# Patient Record
Sex: Male | Born: 2000
Health system: Southern US, Community
[De-identification: ages and names within clinical notes are randomized; demographics above are authoritative.]

## PROBLEM LIST (undated history)

## (undated) ENCOUNTER — Emergency Department (HOSPITAL_COMMUNITY): Discharge status: Absent without leave | Payer: Self-pay

## (undated) HISTORY — PX: FOOT SURGERY: SHX648

## (undated) HISTORY — PX: DENTAL SURGERY: SHX609

---
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
## 2021-06-21 ENCOUNTER — Ambulatory Visit (INDEPENDENT_AMBULATORY_CARE_PROVIDER_SITE_OTHER): Payer: Commercial Managed Care - PPO

## 2021-06-21 ENCOUNTER — Ambulatory Visit
Admission: EM | Admit: 2021-06-21 | Discharge: 2021-06-21 | Disposition: A | Discharge status: Home / Self Care | Payer: Commercial Managed Care - PPO | Attending: Urgent Care | Admitting: Urgent Care

## 2021-06-21 ENCOUNTER — Other Ambulatory Visit: Payer: Self-pay

## 2021-06-21 DIAGNOSIS — M79672 Pain in left foot: Secondary | ICD-10-CM | POA: Diagnosis not present

## 2021-06-21 DIAGNOSIS — M25472 Effusion, left ankle: Secondary | ICD-10-CM

## 2021-06-21 DIAGNOSIS — M25572 Pain in left ankle and joints of left foot: Secondary | ICD-10-CM

## 2021-06-21 DIAGNOSIS — S93402A Sprain of unspecified ligament of left ankle, initial encounter: Secondary | ICD-10-CM | POA: Diagnosis not present

## 2021-06-21 MED ORDER — NAPROXEN 500 MG PO TABS: 500.0000 mg | ORAL_TABLET | Freq: Two times a day (BID) | ORAL | 0 refills | Status: DC

## 2021-06-21 NOTE — ED Triage Notes (Signed)
Pt presents with pain and swelling in the left ankle, after he twisted playing basketball.  ?

## 2021-06-21 NOTE — ED Provider Notes (Signed)
?[place] ? ? ?MRN: [id number] DOB: 2000/08/04 ? ?Subjective:  ? ?Carl Calhoun is a 21 y.o. male presenting for acute onset today of left foot pain, left ankle pain.  Patient was playing basketball and twisted it from another player stepping on his foot.  He had immediate difficulty bearing any kind of weight.  Has severe pain but does not want any medication in the clinic.   ? ?No current facility-administered medications for this encounter. ? ?Current Outpatient Medications:  ?  meclizine (ANTIVERT) 25 MG tablet, Take 1 tablet (25 mg total) by mouth 3 (three) times daily as needed for dizziness., Disp: 30 tablet, Rfl: 0 ?  ondansetron (ZOFRAN) 4 MG tablet, Take 1 tablet (4 mg total) by mouth every 6 (six) hours., Disp: 12 tablet, Rfl: 0  ? ?No Known Allergies ? ?History reviewed. No pertinent past medical history.  ? ?Past Surgical History:  ?Procedure Laterality Date  ? DENTAL SURGERY    ? FOOT SURGERY Left   ? aug 2019  ? ? ?Family History  ?Problem Relation Age of Onset  ? Cancer Maternal Grandfather   ? Cancer Paternal Grandfather   ? ? ?Social History  ? ?Tobacco Use  ? Smoking status: Never  ? Smokeless tobacco: Never  ?Vaping Use  ? Vaping Use: Never used  ?Substance Use Topics  ? Alcohol use: No  ? Drug use: Never  ? ? ?ROS ? ? ?Objective:  ? ?Vitals: ?BP 135/81 (BP Location: Right Arm)   Pulse 80   Temp 98.8 ?F (37.1 ?C) (Oral)   Resp 16   SpO2 96%  ? ?Physical Exam ?Constitutional:   ?   General: He is not in acute distress. ?   Appearance: Normal appearance. He is well-developed and normal weight. He is not ill-appearing, toxic-appearing or diaphoretic.  ?HENT:  ?   Head: Normocephalic and atraumatic.  ?   Right Ear: External ear normal.  ?   Left Ear: External ear normal.  ?   Nose: Nose normal.  ?   Mouth/Throat:  ?   Pharynx: Oropharynx is clear.  ?Eyes:  ?   General: No scleral icterus.    ?   Right eye: No discharge.     ?   Left eye: No discharge.  ?   Extraocular  Movements: Extraocular movements intact.  ?Cardiovascular:  ?   Rate and Rhythm: Normal rate.  ?Pulmonary:  ?   Effort: Pulmonary effort is normal.  ?Musculoskeletal:  ?   Cervical back: Normal range of motion.  ?   Left ankle: Swelling present. No deformity, ecchymosis or lacerations. Tenderness present over the lateral malleolus, ATF ligament, AITF ligament, CF ligament, posterior TF ligament and base of 5th metatarsal. No medial malleolus or proximal fibula tenderness. Decreased range of motion.  ?   Left Achilles Tendon: No tenderness or defects. Thompson's test negative.  ?Neurological:  ?   Mental Status: He is alert and oriented to person, place, and time.  ?Psychiatric:     ?   Mood and Affect: Mood normal.     ?   Behavior: Behavior normal.     ?   Thought Content: Thought content normal.     ?   Judgment: Judgment normal.  ? ? ?DG Ankle Complete Left ? ?Result Date: 06/21/2021 ?CLINICAL DATA:  Left ankle pain. Injury. Twisted ankle playing basketball today. Lateral pain. Had surgery on this foot 3 years ago. EXAM: LEFT ANKLE COMPLETE - 3+ VIEW COMPARISON:  Left foot radiographs 11/21/2017 FINDINGS: The ankle mortise is symmetric and intact. Moderate lateral malleolar soft tissue swelling. Joint spaces are preserved. No acute fracture is seen. No dislocation. Partial visualization of unchanged chronic ossicle at the dorsal aspect of the tarsometatarsal joints on lateral view. IMPRESSION: Moderate lateral malleolar soft tissue swelling. No acute fracture is seen. Electronically Signed   By: Yvonne Kendall M.D.   On: 06/21/2021 17:38   ? ?Left ankle wrapped using 4" Ace wrap in figure-8 method. ? ? ?Assessment and Plan :  ? ?PDMP not reviewed this encounter. ? ?1. Sprain of left ankle, unspecified ligament, initial encounter   ?2. Pain and swelling of left ankle   ?3. Left foot pain   ? ?Will manage for ankle sprain with rice method, NSAID.  Ambulate with crutches as needed, they do have some at home.   Counseled patient on potential for adverse effects with medications prescribed/recommended today, ER and return-to-clinic precautions discussed, patient verbalized understanding. ? ?  ?Jaynee Eagles, PA-C ?06/21/21 1803 ? ?

## 2022-07-04 ENCOUNTER — Emergency Department (HOSPITAL_COMMUNITY): Payer: BLUE CROSS/BLUE SHIELD

## 2022-07-04 ENCOUNTER — Encounter (HOSPITAL_COMMUNITY): Payer: Self-pay | Admitting: Emergency Medicine

## 2022-07-04 ENCOUNTER — Emergency Department (HOSPITAL_COMMUNITY)
Admission: EM | Admit: 2022-07-04 | Discharge: 2022-07-04 | Disposition: A | Discharge status: Home / Self Care | Payer: BLUE CROSS/BLUE SHIELD | Attending: Emergency Medicine | Admitting: Emergency Medicine

## 2022-07-04 ENCOUNTER — Other Ambulatory Visit: Payer: Self-pay

## 2022-07-04 DIAGNOSIS — R0789 Other chest pain: Secondary | ICD-10-CM | POA: Insufficient documentation

## 2022-07-04 DIAGNOSIS — R110 Nausea: Secondary | ICD-10-CM | POA: Diagnosis not present

## 2022-07-04 DIAGNOSIS — R0602 Shortness of breath: Secondary | ICD-10-CM | POA: Insufficient documentation

## 2022-07-04 DIAGNOSIS — R202 Paresthesia of skin: Secondary | ICD-10-CM | POA: Insufficient documentation

## 2022-07-04 DIAGNOSIS — R42 Dizziness and giddiness: Secondary | ICD-10-CM | POA: Insufficient documentation

## 2022-07-04 DIAGNOSIS — R079 Chest pain, unspecified: Secondary | ICD-10-CM | POA: Diagnosis present

## 2022-07-04 LAB — CBC
HCT: 46.3 % (ref 39.0–52.0)
Hemoglobin: 16.2 g/dL (ref 13.0–17.0)
MCH: 30.8 pg (ref 26.0–34.0)
MCHC: 35 g/dL (ref 30.0–36.0)
MCV: 88 fL (ref 80.0–100.0)
Platelets: 270 10*3/uL (ref 150–400)
RBC: 5.26 MIL/uL (ref 4.22–5.81)
RDW: 11.9 % (ref 11.5–15.5)
WBC: 7 10*3/uL (ref 4.0–10.5)
nRBC: 0 % (ref 0.0–0.2)

## 2022-07-04 LAB — BASIC METABOLIC PANEL
Anion gap: 10 (ref 5–15)
BUN: 17 mg/dL (ref 6–20)
CO2: 25 mmol/L (ref 22–32)
Calcium: 9.9 mg/dL (ref 8.9–10.3)
Chloride: 105 mmol/L (ref 98–111)
Creatinine, Ser: 1.12 mg/dL (ref 0.61–1.24)
GFR, Estimated: >60 mL/min (ref >60)
Glucose, Bld: 97 mg/dL (ref 70–99)
Potassium: 4.7 mmol/L (ref 3.5–5.1)
Sodium: 140 mmol/L (ref 135–145)

## 2022-07-04 LAB — TROPONIN I (HIGH SENSITIVITY)
Troponin I (High Sensitivity): <2 ng/L (ref <18)
Troponin I (High Sensitivity): <2 ng/L (ref <18)

## 2022-07-04 LAB — D-DIMER, QUANTITATIVE: D-Dimer, Quant: <0.27 ug/mL-FEU (ref 0.00–0.50)

## 2022-07-04 MED ORDER — PREDNISONE 10 MG PO TABS: ORAL_TABLET | ORAL | 0 refills | Status: DC

## 2022-07-04 MED ORDER — KETOROLAC TROMETHAMINE 60 MG/2ML IM SOLN
30.0000 mg | Freq: Once | INTRAMUSCULAR | Status: AC
  Administered 2022-07-04: 30 mg via INTRAMUSCULAR
  Filled 2022-07-04: qty 2

## 2022-07-04 NOTE — ED Triage Notes (Signed)
Pt states has had several episodes of left chest pain that is sharp with numbness under left arm. Will have dizziness as well with nausea and sob. States this has been initiated each time with exertion at the gym but "sticks around" x 3 days after. Pt arrived aching to left chest and sob. No resp distress or sob noted in triage. Color wnl. Non diahoretic.

## 2022-07-04 NOTE — ED Notes (Signed)
Dc instructions and scripts reviewed with pt and mother. No questions or concerns at this time. Will follow up with PCP if no improvement. Pt ambulated out of ED with steady gait.

## 2022-07-04 NOTE — Discharge Instructions (Signed)
Take the entire course of the prednisone prescribed.  Your lab tests, chest x-ray and EKG are all reassuring today.  I suspect your symptoms are probably secondary to the chest wall strain such as muscular or cartilage strain.  Prednisone should help this greatly.  Plan follow-up with your primary doctor if your symptoms do not improve with this treatment plan.

## 2022-07-05 ENCOUNTER — Telehealth (HOSPITAL_COMMUNITY): Payer: Self-pay | Admitting: Physician Assistant

## 2022-07-05 MED ORDER — PREDNISONE 5 MG/5ML PO SOLN: ORAL | 0 refills | Status: AC

## 2022-07-05 NOTE — ED Provider Notes (Signed)
[place] [facility]PARTMENT AT [facility] Provider Note   CSN: [id number] Arrival date & time: 07/04/22  1005     History  Chief Complaint  Patient presents with   Chest Pain   Numbness    Carl Calhoun is a 22 y.o. male with no significant past medical history presenting for evaluation of left-sided chest pain which started this morning when he was doing a lower body workout.  He describes sharp pain that is sudden onset and radiates across to his chest, also associated with numbness into his left arm area, it is associated with lightheadedness, nausea and shortness of breath.  He has had similar episodes like this over the past 6 months or more, usually associated with workouts at the gym.  The sharp pain has resolved since arrival but he still endorses an aching in his left chest and some shortness of breath.  The pain does not radiate into his back or abdomen.  It is reproducible with deep inspiration and with movement such as stretching his arm and palpating the chest.  He was not doing any weight lifting or upper body workout when this pain occurred.  With prior episodes after ibuprofen and about 3 days rest the symptoms have resolved.  Patient does not smoke, no personal or family risk factors for ACS, or cardiac illness at a early age.  He denies pain or swelling in his extremities, no recent long periods of bedrest or long periods of sedation.  No history of chest trauma.  The history is provided by the patient.       Home Medications Prior to Admission medications   Medication Sig Start Date End Date Taking? Authorizing Provider  meclizine (ANTIVERT) 25 MG tablet Take 1 tablet (25 mg total) by mouth 3 (three) times daily as needed for dizziness. 01/30/20   Wurst, GrenadaBrittany, PA-C  naproxen (NAPROSYN) 500 MG tablet Take 1 tablet (500 mg total) by mouth 2 (two) times daily with a meal. 06/21/21   Wallis BambergMani, Mario, PA-C  ondansetron (ZOFRAN) 4 MG tablet Take 1 tablet (4  mg total) by mouth every 6 (six) hours. 01/30/20   Wurst, GrenadaBrittany, PA-C  predniSONE 5 MG/5ML solution Take 60 mLs (60 mg total) by mouth daily with breakfast for 1 day, THEN 50 mLs (50 mg total) daily with breakfast for 1 day, THEN 40 mLs (40 mg total) daily with breakfast for 1 day, THEN 30 mLs (30 mg total) daily with breakfast for 1 day, THEN 20 mLs (20 mg total) daily with breakfast for 1 day, THEN 10 mLs (10 mg total) daily with breakfast for 1 day. 07/05/22 07/11/22  Carmel SacramentoBeatty, Celeste A, PA-C  promethazine (PHENERGAN) 25 MG tablet Take 1 tablet (25 mg total) by mouth every 8 (eight) hours as needed for nausea or vomiting. 11/19/17 04/24/19  Vivi BarrackWagoner, Matthew R, DPM      Allergies    Patient has no known allergies.    Review of Systems   Review of Systems  Constitutional:  Negative for fever.  HENT:  Negative for congestion and sore throat.   Eyes: Negative.   Respiratory:  Positive for shortness of breath. Negative for cough and chest tightness.   Cardiovascular:  Positive for chest pain.  Gastrointestinal:  Positive for nausea. Negative for abdominal pain and vomiting.  Genitourinary: Negative.   Musculoskeletal:  Negative for arthralgias, joint swelling and neck pain.  Skin: Negative.  Negative for rash and wound.  Neurological:  Negative for dizziness, weakness,  light-headedness, numbness and headaches.  Psychiatric/Behavioral: Negative.      Physical Exam Updated Vital Signs BP 122/71   Pulse 67   Temp 98 F (36.7 C) (Oral)   Resp 19   SpO2 97%  Physical Exam Vitals and nursing note reviewed.  Constitutional:      Appearance: He is well-developed.  HENT:     Head: Normocephalic and atraumatic.  Eyes:     Conjunctiva/sclera: Conjunctivae normal.  Cardiovascular:     Rate and Rhythm: Normal rate and regular rhythm.     Heart sounds: Normal heart sounds. No murmur heard. Pulmonary:     Effort: Pulmonary effort is normal.     Breath sounds: Normal breath sounds. No  wheezing, rhonchi or rales.  Abdominal:     General: Bowel sounds are normal.     Palpations: Abdomen is soft. There is no mass.     Tenderness: There is no abdominal tenderness. There is no guarding.  Musculoskeletal:        General: Normal range of motion.     Cervical back: Normal range of motion.     Right lower leg: No edema.     Left lower leg: No edema.  Skin:    General: Skin is warm and dry.  Neurological:     General: No focal deficit present.     Mental Status: He is alert.     ED Results / Procedures / Treatments   Labs (all labs ordered are listed, but only abnormal results are displayed) Labs Reviewed  BASIC METABOLIC PANEL  CBC  D-DIMER, QUANTITATIVE  TROPONIN I (HIGH SENSITIVITY)  TROPONIN I (HIGH SENSITIVITY)    EKG EKG Interpretation  Date/Time:  Thursday July 04 2022 10:47:14 EDT Ventricular Rate:  81 PR Interval:  156 QRS Duration: 96 QT Interval:  364 QTC Calculation: 422 R Axis:   87 Text Interpretation: Normal sinus rhythm with sinus arrhythmia Normal ECG No previous ECGs available Confirmed by Lorre Nick ([id number]) on 07/05/2022 1:13:47 PM  Radiology DG Chest 2 View  Result Date: 07/04/2022 CLINICAL DATA:  Chest pain starting yesterday. EXAM: CHEST - 2 VIEW COMPARISON:  Chest radiographs 04/23/2018 FINDINGS: Cardiac silhouette and mediastinal contours are within normal limits. The lungs are clear. No pleural effusion or pneumothorax. No acute skeletal abnormality. IMPRESSION: No active cardiopulmonary disease. Electronically Signed   By: Neita Garnet M.D.   On: 07/04/2022 11:07    Procedures Procedures    Medications Ordered in ED Medications  ketorolac (TORADOL) injection 30 mg (30 mg Intramuscular Given 07/04/22 1319)    ED Course/ Medical Decision Making/ A&P                             Medical Decision Making Patient presenting with sharp sudden onset chest pain while doing a lower body weight resisted workout at his gym today, so  she was shortness of breath but also somewhat reproducible.  He describes prior similar episodes that resolve after several days.  Differential diagnosis including ACS, PE, pericarditis, pleuritis costochondritis or other chest wall pain.  Patient was given a IM dose of Toradol while awaiting workup and he did get significant symptom relief with this treatment, favoring this is a musculoskeletal source of his symptoms, possibly a costochondritis or muscle strain.  He was prescribed a course of prednisone, plan follow-up with his primary provider symptoms persist, returning here for any worsening symptoms.  Amount and/or Complexity of Data Reviewed  Labs: ordered.    Details: Labs are reviewed and reassuring, he has had a negative delta troponin, also a negative D-dimer, with his low risk of PE no further imaging to rule out pulmonary embolism is indicated.  His electrolytes are normal and he has a normal CBC with a hemoglobin of 16.2. Radiology: ordered.    Details: Chest x-ray is negative for acute cardiopulmonary processes. ECG/medicine tests: ordered.    Details: Rate 81, normal sinus rhythm with sinus arrhythmia.  Risk Prescription drug management.           Final Clinical Impression(s) / ED Diagnoses Final diagnoses:  Chest wall pain    Rx / DC Orders ED Discharge Orders          Ordered    predniSONE (DELTASONE) 10 MG tablet  Status:  Discontinued        07/04/22 1514              Burgess Amordol, Josilyn Shippee, PA-C 07/05/22 1821    Vanetta MuldersZackowski, Scott, MD 07/07/22 [phone]

## 2022-07-05 NOTE — Telephone Encounter (Signed)
Phone call received by nurse, requested prednisone prescription be changed to liquid, this was changed and sent, same dosing as it was on discharge from yesterday.

## 2023-04-23 ENCOUNTER — Ambulatory Visit
Admission: EM | Admit: 2023-04-23 | Discharge: 2023-04-23 | Disposition: A | Discharge status: Home / Self Care | Payer: BLUE CROSS/BLUE SHIELD | Attending: Nurse Practitioner | Admitting: Nurse Practitioner

## 2023-04-23 ENCOUNTER — Ambulatory Visit (INDEPENDENT_AMBULATORY_CARE_PROVIDER_SITE_OTHER): Payer: BLUE CROSS/BLUE SHIELD

## 2023-04-23 DIAGNOSIS — R0789 Other chest pain: Secondary | ICD-10-CM

## 2023-04-23 NOTE — Discharge Instructions (Signed)
Chest x-ray is negative for any broken bones or collapsed lung, which is great.  Recommend Tylenol alternating with Motrin as needed for soreness that develops over the next couple of days.  Make sure you are drinking plenty of water to keep your muscles hydrated.  If you develop vision changes, severe headache that is not improved with medicine, change in behavior or mental status, loss of consciousness, or vomiting, please seek care emergently.
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
